# Patient Record
Sex: Male | Born: 1990 | Race: White | Marital: Single
Health system: Southern US, Community
[De-identification: ages and names within clinical notes are randomized; demographics above are authoritative.]

---

## 2014-08-12 ENCOUNTER — Other Ambulatory Visit: Payer: Self-pay | Admitting: Occupational Medicine

## 2014-08-12 ENCOUNTER — Ambulatory Visit
Admission: RE | Admit: 2014-08-12 | Discharge: 2014-08-12 | Disposition: A | Discharge status: Home / Self Care | Payer: PRIVATE HEALTH INSURANCE | Source: Ambulatory Visit | Attending: Occupational Medicine | Admitting: Occupational Medicine

## 2014-08-12 DIAGNOSIS — Z021 Encounter for pre-employment examination: Secondary | ICD-10-CM
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
## 2019-06-24 ENCOUNTER — Ambulatory Visit: Payer: Self-pay | Attending: Internal Medicine

## 2019-06-24 DIAGNOSIS — Z23 Encounter for immunization: Secondary | ICD-10-CM

## 2019-06-24 NOTE — Progress Notes (Signed)
   Cov[facility] Vaccination [facility]  Name:  Colton Beck    MRN: [id number] DOB: 1990-04-13  06/24/2019  Colton Beck was observed post Covid-19 immunization for 15 minutes without incident. He was provided with Vaccine Information Sheet and instruction to access the V-Safe system.   Colton Beck was instructed to call 911 with any severe reactions post vaccine: Marland Kitchen Difficulty breathing  . Swelling of face and throat  . A fast heartbeat  . A bad rash all over body  . Dizziness and weakness   Immunizations Administered    Name Date Dose VIS Date Route   Pfizer COVID-19 Vaccine 06/24/2019  1:23 PM 0.3 mL 03/03/2018 Intramuscular   Manufacturer: [employer], [employer]   Lot: [id number]   NDC: [address]-2

## 2019-07-15 ENCOUNTER — Ambulatory Visit: Payer: Self-pay | Attending: Internal Medicine

## 2019-07-29 ENCOUNTER — Ambulatory Visit: Payer: Self-pay | Attending: Internal Medicine

## 2019-07-29 DIAGNOSIS — Z23 Encounter for immunization: Secondary | ICD-10-CM

## 2019-07-29 NOTE — Progress Notes (Signed)
   Covid-19 Vaccination Clinic  Name:  Colton Beck    MRN: [id number] DOB: Oct 13, 1990  07/29/2019  [name] was observed post Covid-19 immunization for 15 minutes without incident. He was provided with Vaccine Information Sheet and instruction to access the V-Safe system.   [name] was instructed to call 911 with any severe reactions post vaccine: Marland Kitchen Difficulty breathing  . Swelling of face and throat  . A fast heartbeat  . A bad rash all over body  . Dizziness and weakness   Immunizations Administered    Name Date Dose VIS Date Route   Pfizer COVID-19 Vaccine 07/29/2019 12:03 PM 0.3 mL 03/03/2018 Intramuscular   Manufacturer: [employer], [employer]   Lot: [id number]   NDC: [address]-9
